# Patient Record
Sex: Male | Born: 2000 | Race: White | Hispanic: No | Marital: Married | Smoking: Never smoker
Health system: Southern US, Community
[De-identification: ages and names within clinical notes are randomized; demographics above are authoritative.]

## PROBLEM LIST (undated history)

## (undated) DIAGNOSIS — M76899 Other specified enthesopathies of unspecified lower limb, excluding foot: Secondary | ICD-10-CM

## (undated) DIAGNOSIS — M549 Dorsalgia, unspecified: Secondary | ICD-10-CM

## (undated) HISTORY — PX: CYST EXCISION: SHX5701

## (undated) HISTORY — PX: LASIK: SHX215

---
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
## 2022-08-14 ENCOUNTER — Emergency Department (HOSPITAL_COMMUNITY)
Admission: EM | Admit: 2022-08-14 | Discharge: 2022-08-14 | Disposition: A | Discharge status: Home / Self Care | Payer: Worker's Compensation | Attending: Emergency Medicine | Admitting: Emergency Medicine

## 2022-08-14 ENCOUNTER — Other Ambulatory Visit: Payer: Self-pay

## 2022-08-14 ENCOUNTER — Emergency Department (HOSPITAL_COMMUNITY): Payer: Self-pay

## 2022-08-14 ENCOUNTER — Encounter (HOSPITAL_COMMUNITY): Payer: Self-pay | Admitting: *Deleted

## 2022-08-14 DIAGNOSIS — S90211A Contusion of right great toe with damage to nail, initial encounter: Secondary | ICD-10-CM | POA: Insufficient documentation

## 2022-08-14 DIAGNOSIS — Y990 Civilian activity done for income or pay: Secondary | ICD-10-CM | POA: Diagnosis not present

## 2022-08-14 DIAGNOSIS — W208XXA Other cause of strike by thrown, projected or falling object, initial encounter: Secondary | ICD-10-CM | POA: Diagnosis not present

## 2022-08-14 DIAGNOSIS — S92421A Displaced fracture of distal phalanx of right great toe, initial encounter for closed fracture: Secondary | ICD-10-CM | POA: Diagnosis not present

## 2022-08-14 DIAGNOSIS — S99921A Unspecified injury of right foot, initial encounter: Secondary | ICD-10-CM | POA: Diagnosis present

## 2022-08-14 HISTORY — DX: Dorsalgia, unspecified: M54.9

## 2022-08-14 HISTORY — DX: Other specified enthesopathies of unspecified lower limb, excluding foot: M76.899

## 2022-08-14 NOTE — Discharge Instructions (Signed)
You were seen in the emergency department for continued swelling bruising of your right great toe.  You have a fracture at the end of the great toe.  This will generally heal over time.  You can use the hard soled shoe or buddy tape to help stabilize the area.  Elevate when able.  Follow-up with podiatry as needed.  Tylenol and ibuprofen for pain.

## 2022-08-14 NOTE — ED Provider Notes (Signed)
[facility] Provider Note   CSN: [id number] Arrival date & time: 08/14/22  1005     History  Chief Complaint  Patient presents with   Toe Injury    Charles Wilkins is a 22 y.o. male.  He is here with a complaint of right great toe pain after he dropped something heavy on it a few days ago at work.  He has had some blood underneath the nail.  Pain is worse with ambulation.  Symptoms started feel little bit better.  No other injuries or complaints.  The history is provided by the patient.  Toe Pain This is a new problem. The current episode started more than 2 days ago. The problem occurs constantly. The problem has been gradually improving. The symptoms are aggravated by bending and walking. The symptoms are relieved by lying down. He has tried rest for the symptoms. The treatment provided mild relief.       Home Medications Prior to Admission medications   Not on File      Allergies    Patient has no known allergies.    Review of Systems   Review of Systems  Constitutional:  Negative for fever.    Physical Exam Updated Vital Signs BP 123/84 (BP Location: Right Arm)   Pulse 78   Temp 98 F (36.7 C) (Oral)   Resp 16   Ht 6' (1.829 m)   Wt 122.5 kg   SpO2 100%   BMI 36.62 kg/m  Physical Exam Vitals and nursing note reviewed.  Constitutional:      Appearance: Normal appearance. He is well-developed.  HENT:     Head: Normocephalic and atraumatic.  Eyes:     Conjunctiva/sclera: Conjunctivae normal.  Pulmonary:     Effort: Pulmonary effort is normal.  Musculoskeletal:        General: Tenderness and signs of injury present.     Cervical back: Neck supple.     Comments: He has a diffusely swollen and ecchymotic right great toe.  There is subungual hematoma with a bit of dried blood at the nail edge.  The rest of the foot and other digits are nontender.  Skin:    General: Skin is warm and dry.  Neurological:      General: No focal deficit present.     Mental Status: He is alert.     GCS: GCS eye subscore is 4. GCS verbal subscore is 5. GCS motor subscore is 6.     ED Results / Procedures / Treatments   Labs (all labs ordered are listed, but only abnormal results are displayed) Labs Reviewed - No data to display  EKG None  Radiology DG Foot Complete Right  Result Date: 08/14/2022 CLINICAL DATA:  Dropped heavy object on great toe. EXAM: RIGHT FOOT COMPLETE - 3+ VIEW COMPARISON:  None Available. FINDINGS: Small slightly displaced distal tuft fracture of the distal phalanx of the great toe. Associated surrounding soft tissue swelling/edema. The joint spaces are maintained. The other bony structures are intact. IMPRESSION: Small, slightly displaced, distal tuft fracture of the distal phalanx of the great toe. Electronically Signed   By: Marijo Sanes M.D.   On: 08/14/2022 11:40    Procedures Procedures    Medications Ordered in ED Medications - No data to display  ED Course/ Medical Decision Making/ A&P  Medical Decision Making Amount and/or Complexity of Data Reviewed Radiology: ordered.   Differential diagnosis includes contusion, fracture, dislocation, subungual hematoma, nail avulsion.  X-ray showing small tuft fracture.  Patient states his symptoms are improving.  Do not feel he would benefit from trephination at this point.  Recommended continued symptomatic treatment and follow-up with podiatry as needed.  Return instructions discussed.        Final Clinical Impression(s) / ED Diagnoses Final diagnoses:  Closed displaced fracture of distal phalanx of right great toe, initial encounter    Rx / DC Orders ED Discharge Orders     None         Hayden Rasmussen, MD 08/14/22 1815

## 2022-08-14 NOTE — ED Triage Notes (Signed)
Pt reports he dropped a 50-75lb motor on his right great toe on 08/09/22 and is concerned he has broken it.
